# Patient Record
Sex: Male | Born: 1980 | Hispanic: Yes | Marital: Single | Smoking: Never smoker
Health system: Southern US, Community
[De-identification: ages and names within clinical notes are randomized; demographics above are authoritative.]

---

## 2013-04-16 ENCOUNTER — Emergency Department: Payer: Self-pay | Admitting: Emergency Medicine

## 2016-01-18 ENCOUNTER — Encounter: Payer: Self-pay | Admitting: *Deleted

## 2016-01-18 ENCOUNTER — Emergency Department
Admission: EM | Admit: 2016-01-18 | Discharge: 2016-01-18 | Disposition: A | Discharge status: Home / Self Care | Payer: Self-pay | Attending: Emergency Medicine | Admitting: Emergency Medicine

## 2016-01-18 DIAGNOSIS — J069 Acute upper respiratory infection, unspecified: Secondary | ICD-10-CM | POA: Insufficient documentation

## 2016-01-18 MED ORDER — PSEUDOEPH-BROMPHEN-DM 30-2-10 MG/5ML PO SYRP: 10.0000 mL | ORAL_SOLUTION | Freq: Four times a day (QID) | ORAL | Status: AC | PRN

## 2016-01-18 MED ORDER — FLUTICASONE PROPIONATE 50 MCG/ACT NA SUSP: 2.0000 | Freq: Every day | NASAL | Status: AC

## 2016-01-18 NOTE — Discharge Instructions (Signed)

## 2016-01-18 NOTE — ED Notes (Signed)
Spanish interpreter at bedside for discharge.  

## 2016-01-18 NOTE — ED Provider Notes (Signed)
[facility] Emergency Department Provider Note  ____________________________________________  Time seen: Approximately 8:49 PM  I have reviewed the triage vital signs and the nursing notes.   HISTORY  Chief Complaint Nasal Congestion    HPI Roy West is a 35 y.o. male , NAD, presents to the emergency department with 1 month history of nasal congestion and runny nose. Spanish language interpreter present throughout the visit. Has taken NyQuil with minor relief. Has not taken anything during the day. Does note that warmer temperatures improve his nasal breathing. Denies shortness of breath, chest pain, back pain, wheezing. No fevers, chills, body aches.   No past medical history on file.  There are no active problems to display for this patient.   No past surgical history on file.  Current Outpatient Rx  Name  Route  Sig  Dispense  Refill  . brompheniramine-pseudoephedrine-DM 30-2-10 MG/5ML syrup   Oral   Take 10 mLs by mouth 4 (four) times daily as needed.   200 mL   0   . fluticasone (FLONASE) 50 MCG/ACT nasal spray   Each Nare   Place 2 sprays into both nostrils daily.   16 g   0     Allergies Review of patient's allergies indicates no known allergies.  No family history on file.  Social History Social History  Substance Use Topics  . Smoking status: Never Smoker   . Smokeless tobacco: None  . Alcohol Use: Yes     Review of Systems  Constitutional: No fever/chills, fatigue Eyes: No discharge ENT: Positive nasal congestion and profuse clear rhinorrhea. No sore throat or ear pain, sinus pressure or pain. Cardiovascular: No chest pain. Respiratory: Mild cough. No shortness of breath. No wheezing.  Musculoskeletal: Negative for general myalgias.  Skin: Negative for rash. Neurological: Negative for headaches, focal weakness or numbness. 10-point ROS otherwise  negative.  ____________________________________________   PHYSICAL EXAM:  VITAL SIGNS: ED Triage Vitals  Enc Vitals Group     BP 01/18/16 2001 151/99 mmHg     Pulse Rate 01/18/16 2001 68     Resp 01/18/16 2001 20     Temp 01/18/16 2001 99.1 F (37.3 C)     Temp Source 01/18/16 2001 Oral     SpO2 01/18/16 2001 99 %     Weight 01/18/16 2001 145 lb (65.772 kg)     Height 01/18/16 2001  (1.6 m)     Head Cir --      Peak Flow --      Pain Score --      Pain Loc --      Pain Edu? --      Excl. in GC? --     Constitutional: Alert and oriented. Well appearing and in no acute distress. Eyes: Conjunctivae are normal. PERRL. EOMI without pain.  Head: Atraumatic. ENT:      Ears: TMs visualized bilaterally without perforation, erythema, bulging, retraction, effusion. Bilateral external ear canals without swelling, erythema, discharge.      Nose: Moderate Left-sided congestion with trace clear rhinnorhea.      Mouth/Throat: Mucous membranes are moist. Pharynx without erythema, swelling, exudate Neck: Supple with full range of motion Hematological/Lymphatic/Immunilogical: No cervical lymphadenopathy. Cardiovascular: Normal rate, regular rhythm. Normal S1 and S2.   Respiratory: Normal respiratory effort without tachypnea or retractions. Lungs CTAB. Neurologic:  Normal speech and language. No gross focal neurologic deficits are appreciated.  Skin:  Skin is warm, dry and intact. No rash noted. Psychiatric: Mood  and affect are normal. Speech and behavior are normal. Patient exhibits appropriate insight and judgement.   ____________________________________________   LABS  None ____________________________________________  EKG  None ____________________________________________  RADIOLOGY  None ____________________________________________    PROCEDURES  Procedure(s) performed: None   Medications - No data to  display   ____________________________________________   INITIAL IMPRESSION / ASSESSMENT AND PLAN / ED COURSE  Patient's diagnosis is consistent with viral upper respiratory infection. Patient will be discharged home with prescriptions for days and Bromfed-DM to utilize as directed. Patient is to follow up with [facility] if symptoms persist past this treatment course. Patient is given ED precautions to return to the ED for any worsening or new symptoms.    ____________________________________________  FINAL CLINICAL IMPRESSION(S) / ED DIAGNOSES  Final diagnoses:  Upper respiratory infection, viral      NEW MEDICATIONS STARTED DURING THIS VISIT:  New Prescriptions   BROMPHENIRAMINE-PSEUDOEPHEDRINE-DM 30-2-10 MG/5ML SYRUP    Take 10 mLs by mouth 4 (four) times daily as needed.   FLUTICASONE (FLONASE) 50 MCG/ACT NASAL SPRAY    Place 2 sprays into both nostrils daily.         Hope Pigeon, PA-C 01/18/16 2137  Sharman Cheek, MD 01/18/16 [phone]

## 2016-01-18 NOTE — ED Notes (Signed)
Pt reports cough, congestion and runny nose.  No sob or chest pain.  Pt alert.

## 2022-02-17 ENCOUNTER — Encounter: Payer: Self-pay | Admitting: Emergency Medicine

## 2022-02-17 ENCOUNTER — Other Ambulatory Visit: Payer: Self-pay

## 2022-02-17 ENCOUNTER — Emergency Department
Admission: EM | Admit: 2022-02-17 | Discharge: 2022-02-18 | Disposition: A | Discharge status: Home / Self Care | Payer: Self-pay | Attending: Emergency Medicine | Admitting: Emergency Medicine

## 2022-02-17 DIAGNOSIS — R42 Dizziness and giddiness: Secondary | ICD-10-CM

## 2022-02-17 DIAGNOSIS — M546 Pain in thoracic spine: Secondary | ICD-10-CM | POA: Insufficient documentation

## 2022-02-17 DIAGNOSIS — X500XXA Overexertion from strenuous movement or load, initial encounter: Secondary | ICD-10-CM | POA: Insufficient documentation

## 2022-02-17 DIAGNOSIS — E876 Hypokalemia: Secondary | ICD-10-CM | POA: Insufficient documentation

## 2022-02-17 LAB — BASIC METABOLIC PANEL
Anion gap: 13 (ref 5–15)
BUN: 15 mg/dL (ref 6–20)
CO2: 23 mmol/L (ref 22–32)
Calcium: 9.8 mg/dL (ref 8.9–10.3)
Chloride: 99 mmol/L (ref 98–111)
Creatinine, Ser: 1.02 mg/dL (ref 0.61–1.24)
GFR, Estimated: >60 mL/min (ref >60)
Glucose, Bld: 121 mg/dL — ABNORMAL HIGH (ref 70–99)
Potassium: 3.3 mmol/L — ABNORMAL LOW (ref 3.5–5.1)
Sodium: 135 mmol/L (ref 135–145)

## 2022-02-17 LAB — CBC
HCT: 45.4 % (ref 39.0–52.0)
Hemoglobin: 16 g/dL (ref 13.0–17.0)
MCH: 30.2 pg (ref 26.0–34.0)
MCHC: 35.2 g/dL (ref 30.0–36.0)
MCV: 85.7 fL (ref 80.0–100.0)
Platelets: 250 10*3/uL (ref 150–400)
RBC: 5.3 MIL/uL (ref 4.22–5.81)
RDW: 12.6 % (ref 11.5–15.5)
WBC: 8.3 10*3/uL (ref 4.0–10.5)
nRBC: 0 % (ref 0.0–0.2)

## 2022-02-17 LAB — TROPONIN I (HIGH SENSITIVITY): Troponin I (High Sensitivity): 3 ng/L (ref <18)

## 2022-02-17 MED ORDER — POTASSIUM CHLORIDE CRYS ER 20 MEQ PO TBCR
40.0000 meq | EXTENDED_RELEASE_TABLET | Freq: Once | ORAL | Status: AC
Start: 2022-02-18 — End: 2022-02-18
  Administered 2022-02-18: 40 meq via ORAL
  Filled 2022-02-17: qty 2

## 2022-02-17 MED ORDER — KETOROLAC TROMETHAMINE 30 MG/ML IJ SOLN
30.0000 mg | Freq: Once | INTRAMUSCULAR | Status: AC
  Administered 2022-02-18: 30 mg via INTRAMUSCULAR
  Filled 2022-02-17: qty 1

## 2022-02-17 MED ORDER — LIDOCAINE 5 % EX PTCH
1.0000 | MEDICATED_PATCH | CUTANEOUS | Status: DC
  Administered 2022-02-18: 1 via TRANSDERMAL
  Filled 2022-02-17: qty 1

## 2022-02-17 MED ORDER — MECLIZINE HCL 25 MG PO TABS
25.0000 mg | ORAL_TABLET | Freq: Once | ORAL | Status: AC
  Administered 2022-02-18: 25 mg via ORAL
  Filled 2022-02-17: qty 1

## 2022-02-17 NOTE — ED Provider Notes (Signed)
[facility] ? ?[facility] ?Provider Note ? ? ? Event Date/Time  ? First MD Initiated Contact with Patient 02/17/22 2312   ?  (approximate) ? ? ?History  ? ?Back Pain and Dizziness ? ? ?HPI ? ?Roy West is a 41 y.o. male who presents to the ED for evaluation of Back Pain and Dizziness ?  ?Patient presents to the ED for evaluation of left-sided atraumatic thoracic back pain and positional dizziness.  He reports symptoms since Monday.  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  Denies weakness to the extremities, syncopal episodes, fever or IVDU. ? ?Reports positional dizziness whenever he stands quickly or turns his head to the left.  Denies vision changes, tinnitus.  Reports no dizziness right now and reports feeling fine while seated. ? ?History and physical facilitated by Spanish interpreter ? ?Physical Exam  ? ?Triage Vital Signs: ?ED Triage Vitals  ?Enc Vitals Group  ?   BP 02/17/22 2039 (!) 168/98  ?   Pulse Rate 02/17/22 2039 84  ?   Resp 02/17/22 2039 16  ?   Temp 02/17/22 2039 99.2 ?F (37.3 ?C)  ?   Temp Source 02/17/22 2039 Oral  ?   SpO2 02/17/22 2039 100 %  ?   Weight 02/17/22 2040 170 lb (77.1 kg)  ?   Height 02/17/22 2040 5\' 3"  (1.6 m)  ?   Head Circumference --   ?   Peak Flow --   ?   Pain Score 02/17/22 2039 3  ?   Pain Loc --   ?   Pain Edu? --   ?   Excl. in GC? --   ? ? ?Most recent vital signs: ?Vitals:  ? 02/17/22 2330 02/18/22 0030  ?BP: 131/86 130/90  ?Pulse: 78 74  ?Resp: 15 16  ?Temp:    ?SpO2: 97% 97%  ? ? ?General: Awake, no distress.  Pleasant and conversational via interpreter.  Able to stand and ambulate in the room without symptoms. ?CV:  Good peripheral perfusion. RRR ?Resp:  Normal effort. CTAB ?Abd:  No distention. Soft and benign ?MSK:  No deformity noted.  ?Neuro:  No focal deficits appreciated. Cranial nerves II through XII intact ?5/5 strength and sensation in all 4  extremities ?Other:   ? ? ?ED Results / Procedures / Treatments  ? ?Labs ?(all labs ordered are listed, but only abnormal results are displayed) ?Labs Reviewed  ?BASIC METABOLIC PANEL - Abnormal; Notable for the following components:  ?    Result Value  ? Potassium 3.3 (*)   ? Glucose, Bld 121 (*)   ? All other components within normal limits  ?CBC  ?URINALYSIS, ROUTINE W REFLEX MICROSCOPIC  ?TROPONIN I (HIGH SENSITIVITY)  ? ? ?EKG ?Sinus rhythm, rate of 79 bpm.  Normal axis and intervals.  No evidence of acute ischemia. ? ?RADIOLOGY ?CXR reviewed by me without evidence of acute cardiopulmonary pathology. ? ?Official radiology report(s): ?DG Chest 2 View ? ?Result Date: 02/18/2022 ?CLINICAL DATA:  Shortness of breath, dizziness, mid back pain for several days. EXAM: CHEST - 2 VIEW COMPARISON:  None. FINDINGS: The heart size and mediastinal contours are within normal limits. Both lungs are clear. The visualized skeletal structures are unremarkable. IMPRESSION: No active cardiopulmonary disease. Electronically Signed   By: 02/20/2022 M.D.   On: 02/18/2022 00:25   ? ?PROCEDURES and INTERVENTIONS: ? ?.1-3 Lead EKG Interpretation ?Performed by:  Delton Prairie, MD ?Authorized by: Delton Prairie, MD  ? ?  Interpretation: normal   ?  ECG rate:  70 ?  ECG rate assessment: normal   ?  Rhythm: sinus rhythm   ?  Ectopy: none   ?  Conduction: normal   ? ?Medications  ?lidocaine (LIDODERM) 5 % 1 patch (1 patch Transdermal Patch Applied 02/18/22 0032)  ?potassium chloride SA (KLOR-CON M) CR tablet 40 mEq (40 mEq Oral Given 02/18/22 0030)  ?meclizine (ANTIVERT) tablet 25 mg (25 mg Oral Given 02/18/22 0031)  ?ketorolac (TORADOL) 30 MG/ML injection 30 mg (30 mg Intramuscular Given 02/18/22 0031)  ? ? ? ?IMPRESSION / MDM / ASSESSMENT AND PLAN / ED COURSE  ?I reviewed the triage vital signs and the nursing notes. ? ?41 year old male presents to the ED with a few days of dizziness and atraumatic back pain.  He has a reassuring examination  without evidence of neurologic or vascular deficits.  No signs of trauma.  Has some mild paraspinal thoracic tenderness, possibly soft tissue in etiology from frequent heavy lifting in his workplace.  Otherwise normal.  Blood work demonstrates hypokalemia, that is repleted orally.  Normal CBC and troponin.  Clear CXR and nonischemic EKG.  No signs of ACS.  Doubt CNS pathology causing his dizziness.  Resolution of symptoms after repletion of his potassium and meclizine. ? ?Clinical Course as of 02/18/22 0511  ?Fri Feb 18, 2022  ?0112 Reassessed.  Feeling well. [DS]  ?  ?Clinical Course User Index ?[DS] Delton Prairie, MD  ? ? ? ?FINAL CLINICAL IMPRESSION(S) / ED DIAGNOSES  ? ?Final diagnoses:  ?Acute left-sided thoracic back pain  ?Dizziness  ?Hypokalemia  ? ? ? ?Rx / DC Orders  ? ?ED Discharge Orders   ? ?      Ordered  ?  lidocaine (LIDODERM) 5 %  Every 12 hours       ? 02/18/22 0114  ? ?  ?  ? ?  ? ? ? ?Note:  This document was prepared using Dragon voice recognition software and may include unintentional dictation errors. ?  ?Delton Prairie, MD ?02/18/22 [phone] ? ?

## 2022-02-17 NOTE — ED Triage Notes (Signed)
Pt to ED from home c/o dizziness, mid back pain, and intermittent SOB for several days.  Denies cough or n/v/d.  Denies injury to back or urinary changes.  Pt A&Ox4, chest rise even and unlabored, skin WNL and in NAD at this time. ?

## 2022-02-18 ENCOUNTER — Emergency Department: Payer: Self-pay

## 2022-02-18 MED ORDER — LIDOCAINE 5 % EX PTCH: 1.0000 | MEDICATED_PATCH | Freq: Two times a day (BID) | CUTANEOUS | 0 refills | Status: AC

## 2022-02-18 NOTE — Discharge Instructions (Signed)
Please take Tylenol and ibuprofen/Advil for your pain.  It is safe to take them together, or to alternate them every few hours.  Take up to 1000mg of Tylenol at a time, up to 4 times per day.  Do not take more than 4000 mg of Tylenol in 24 hours.  For ibuprofen, take 400-600 mg, 4-5 times per day. ° °Please use lidocaine patches at your site of pain.  Apply 1 patch at a time, leave on for 12 hours, then remove for 12 hours.  12 hours on, 12 hours off.  Do not apply more than 1 patch at a time. ° °

## 2022-08-16 IMAGING — CR DG CHEST 2V
2 series · 2 of 2 positions shown · non-contrast
Comparison: None.

CLINICAL DATA: Shortness of breath, dizziness, mid back pain for
several days.

EXAM:
CHEST - 2 VIEW

[chest pa]
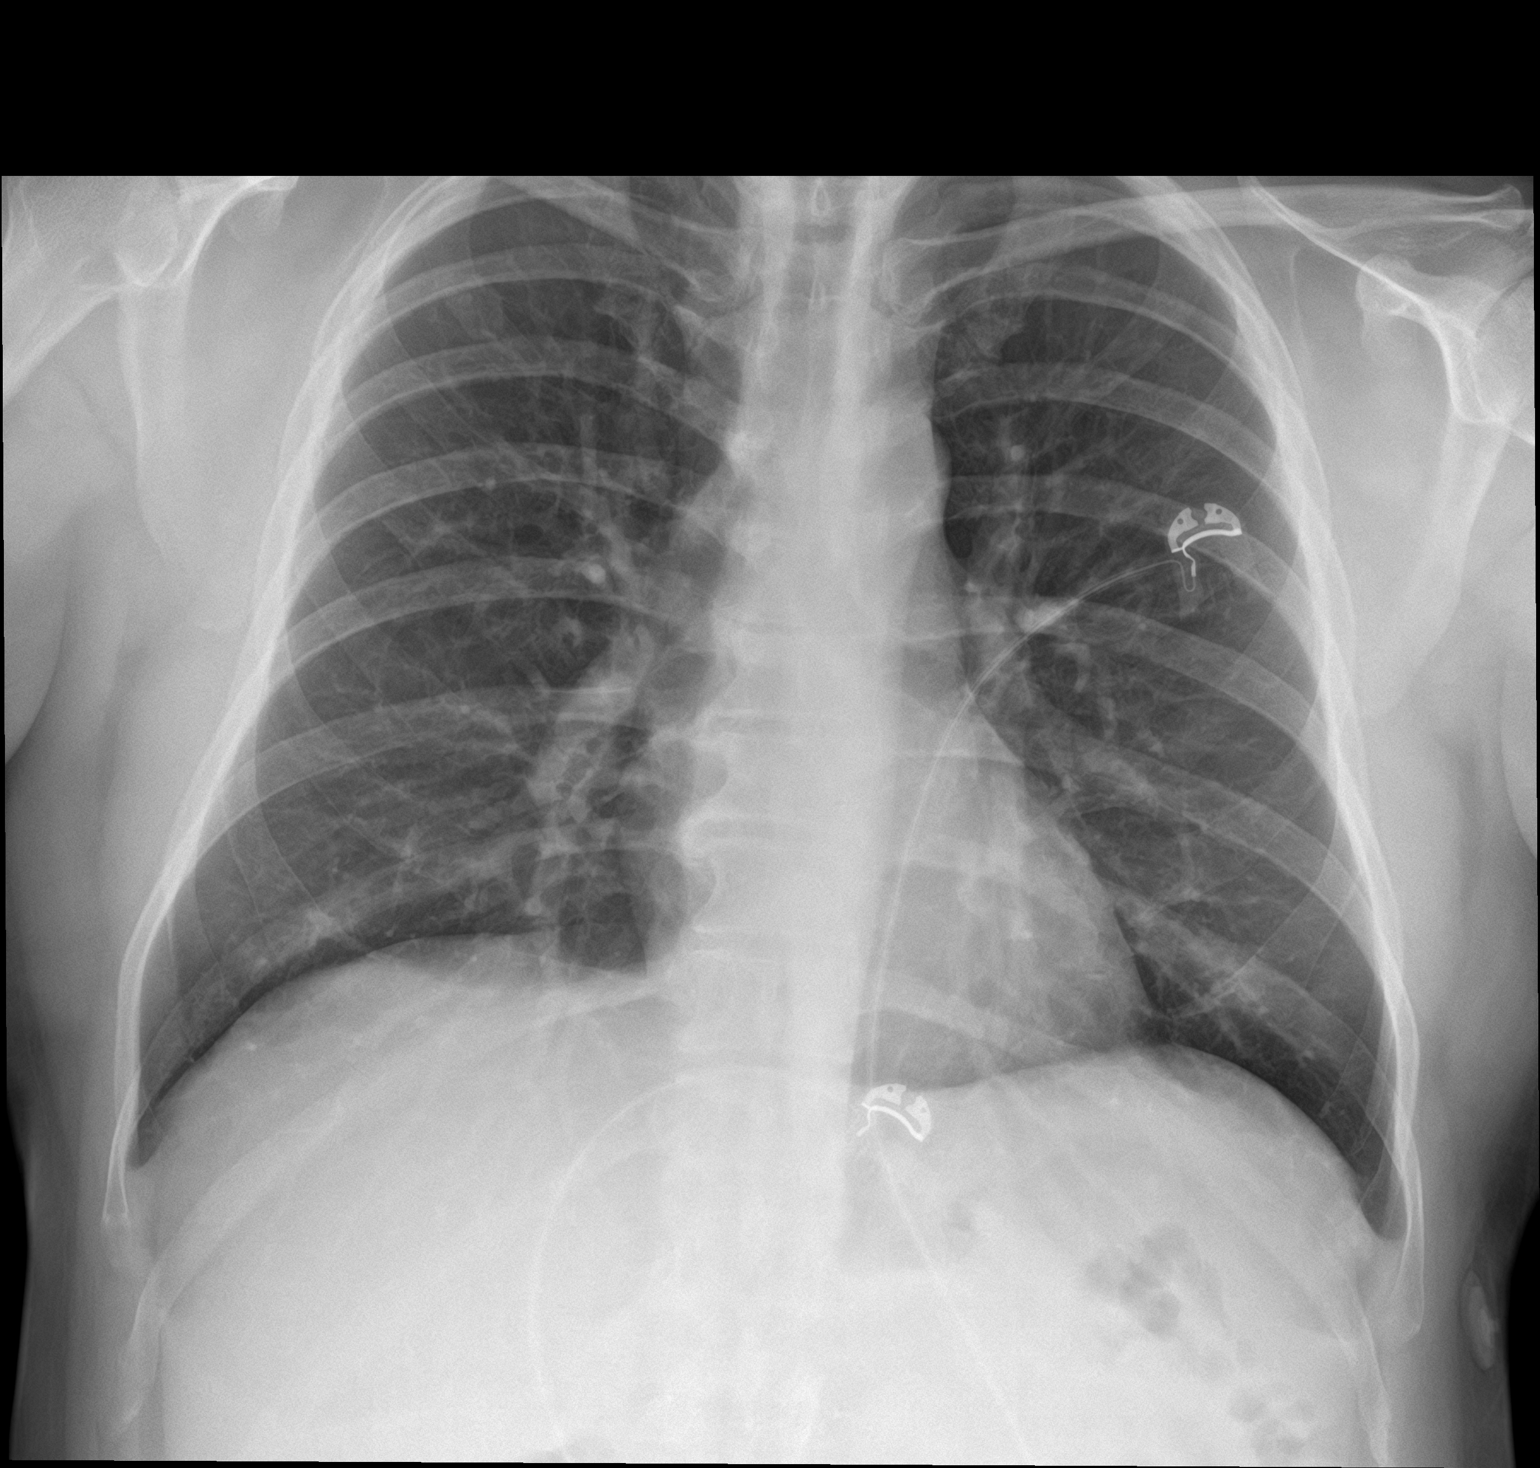

[chest lat]
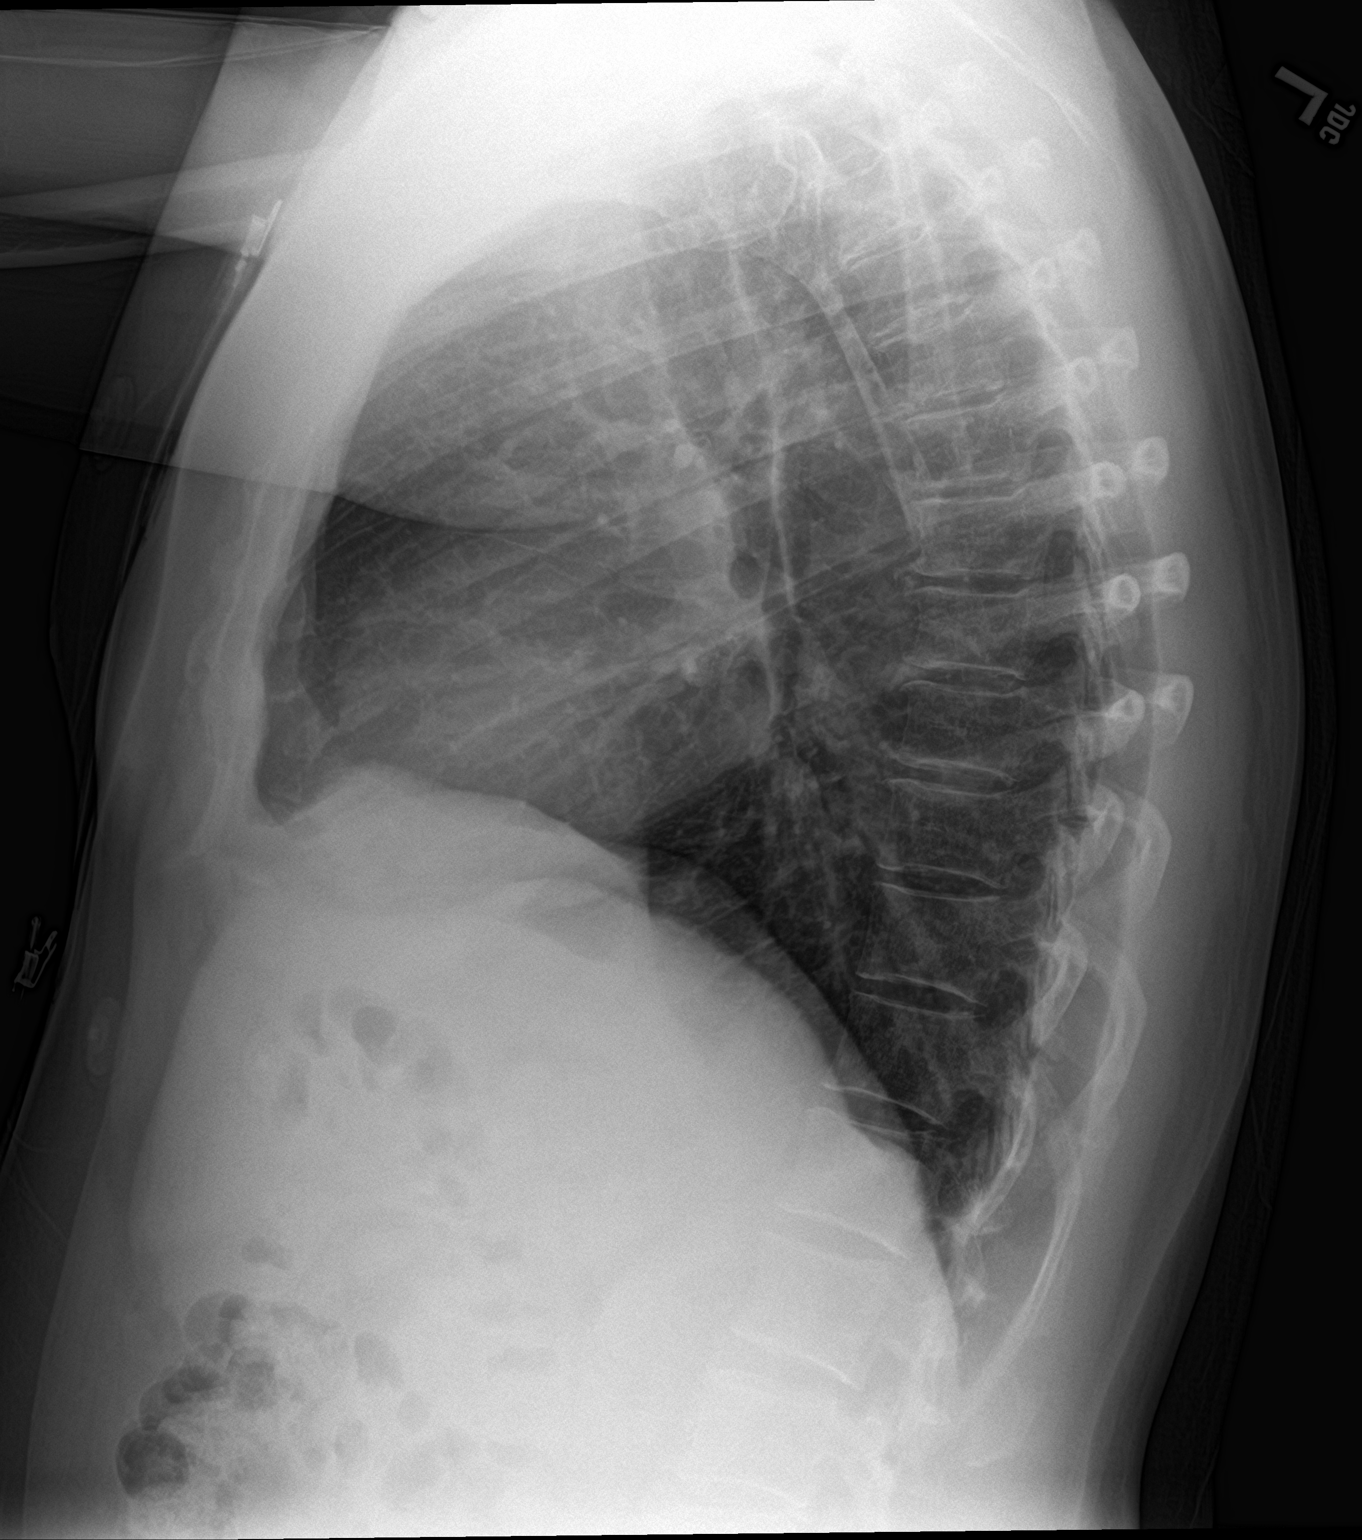

[2 of 2 positions shown; findings below may reference images not displayed]

FINDINGS: The heart size and mediastinal contours are within normal limits.
Both lungs are clear. The visualized skeletal structures are
unremarkable.
IMPRESSION: No active cardiopulmonary disease.

## 2024-11-25 ENCOUNTER — Emergency Department
Admission: EM | Admit: 2024-11-25 | Discharge: 2024-11-25 | Disposition: A | Discharge status: Home / Self Care | Payer: Self-pay | Attending: Emergency Medicine | Admitting: Emergency Medicine

## 2024-11-25 ENCOUNTER — Other Ambulatory Visit: Payer: Self-pay

## 2024-11-25 ENCOUNTER — Emergency Department: Payer: Self-pay

## 2024-11-25 DIAGNOSIS — R0789 Other chest pain: Secondary | ICD-10-CM | POA: Insufficient documentation

## 2024-11-25 DIAGNOSIS — R0602 Shortness of breath: Secondary | ICD-10-CM | POA: Insufficient documentation

## 2024-11-25 LAB — CBC WITH DIFFERENTIAL/PLATELET
Abs Immature Granulocytes: 0.04 K/uL (ref 0.00–0.07)
Basophils Absolute: 0 K/uL (ref 0.0–0.1)
Basophils Relative: 0 %
Eosinophils Absolute: 0 K/uL (ref 0.0–0.5)
Eosinophils Relative: 0 %
HCT: 43.3 % (ref 39.0–52.0)
Hemoglobin: 15.1 g/dL (ref 13.0–17.0)
Immature Granulocytes: 0 %
Lymphocytes Relative: 25 %
Lymphs Abs: 2.6 K/uL (ref 0.7–4.0)
MCH: 30.5 pg (ref 26.0–34.0)
MCHC: 34.9 g/dL (ref 30.0–36.0)
MCV: 87.5 fL (ref 80.0–100.0)
Monocytes Absolute: 0.8 K/uL (ref 0.1–1.0)
Monocytes Relative: 8 %
Neutro Abs: 6.8 K/uL (ref 1.7–7.7)
Neutrophils Relative %: 67 %
Platelets: 233 K/uL (ref 150–400)
RBC: 4.95 MIL/uL (ref 4.22–5.81)
RDW: 12.5 % (ref 11.5–15.5)
WBC: 10.3 K/uL (ref 4.0–10.5)
nRBC: 0 % (ref 0.0–0.2)

## 2024-11-25 LAB — BASIC METABOLIC PANEL WITH GFR
Anion gap: 14 (ref 5–15)
BUN: 13 mg/dL (ref 6–20)
CO2: 22 mmol/L (ref 22–32)
Calcium: 9.3 mg/dL (ref 8.9–10.3)
Chloride: 100 mmol/L (ref 98–111)
Creatinine, Ser: 0.8 mg/dL (ref 0.61–1.24)
GFR, Estimated: >60 mL/min
Glucose, Bld: 123 mg/dL — ABNORMAL HIGH (ref 70–99)
Potassium: 3.6 mmol/L (ref 3.5–5.1)
Sodium: 137 mmol/L (ref 135–145)

## 2024-11-25 LAB — D-DIMER, QUANTITATIVE: D-Dimer, Quant: 0.4 ug{FEU}/mL (ref 0.00–0.50)

## 2024-11-25 LAB — TROPONIN T, HIGH SENSITIVITY
Troponin T High Sensitivity: <15 ng/L (ref 0–19)
Troponin T High Sensitivity: <15 ng/L (ref 0–19)

## 2024-11-25 NOTE — ED Triage Notes (Signed)
 Pt reports he was playing outside with his daughters when he had sudden cp and sob lasting approx 30 min. Reports concern for ongoing SOB while trying to sleep. Sts he feels difficulty when taking in a deep breath.

## 2024-11-25 NOTE — ED Provider Notes (Signed)
 "  [facility] Provider Note    Event Date/Time   First MD Initiated Contact with Patient 11/25/24 [phone]     (approximate)   History   Chief Complaint Shortness of Breath and Chest Pain   HPI  Roy West is a 43 y.o. male with no significant past medical history who presents to the ED complaining of chest pain and shortness of breath.  History is limited as patient is Spanish-speaking only, history obtained via interpreter [phone].  Patient reports sharp discomfort in the left side of his chest starting about 2 hours prior to arrival.  Pain was exacerbated by taking a deep breath, but has since resolved.  He does report some ongoing difficulty breathing, denies any recent fever or cough.  He has not noticed any pain or swelling in his legs.     Physical Exam   Triage Vital Signs: ED Triage Vitals  Encounter Vitals Group     BP 11/25/24 0336 (!) 149/91     Girls Systolic BP Percentile --      Girls Diastolic BP Percentile --      Boys Systolic BP Percentile --      Boys Diastolic BP Percentile --      Pulse Rate 11/25/24 0336 86     Resp 11/25/24 0336 13     Temp 11/25/24 0336 98.3 F (36.8 C)     Temp Source 11/25/24 0336 Oral     SpO2 11/25/24 0336 100 %     Weight --      Height --      Head Circumference --      Peak Flow --      Pain Score 11/25/24 0335 0     Pain Loc --      Pain Education --      Exclude from Growth Chart --     Most recent vital signs: Vitals:   11/25/24 0336  BP: (!) 149/91  Pulse: 86  Resp: 13  Temp: 98.3 F (36.8 C)  SpO2: 100%    Constitutional: Alert and oriented. Eyes: Conjunctivae are normal. Head: Atraumatic. Nose: No congestion/rhinnorhea. Mouth/Throat: Mucous membranes are moist.  Cardiovascular: Normal rate, regular rhythm. Grossly normal heart sounds.  2+ radial pulses bilaterally. Respiratory: Normal respiratory effort.  No retractions. Lungs CTAB.  No chest wall tenderness to  palpation. Gastrointestinal: Soft and nontender. No distention. Musculoskeletal: No lower extremity tenderness nor edema.  Neurologic:  Normal speech and language. No gross focal neurologic deficits are appreciated.    ED Results / Procedures / Treatments   Labs (all labs ordered are listed, but only abnormal results are displayed) Labs Reviewed  BASIC METABOLIC PANEL WITH GFR - Abnormal; Notable for the following components:      Result Value   Glucose, Bld 123 (*)    All other components within normal limits  CBC WITH DIFFERENTIAL/PLATELET  D-DIMER, QUANTITATIVE  TROPONIN T, HIGH SENSITIVITY  TROPONIN T, HIGH SENSITIVITY     EKG  ED ECG REPORT I, Carlin Palin, the attending physician, personally viewed and interpreted this ECG.   Date: 11/25/2024  EKG Time: 3:39  Rate: 78  Rhythm: normal sinus rhythm  Axis: Normal  Intervals:none  ST&T Change: None  RADIOLOGY Chest x-ray reviewed and interpreted by me with no infiltrate, edema, or effusion.  PROCEDURES:  Critical Care performed: No  Procedures   MEDICATIONS ORDERED IN ED: Medications - No data to display   IMPRESSION / MDM / ASSESSMENT AND  PLAN / ED COURSE  I reviewed the triage vital signs and the nursing notes.                              43 y.o. male with no significant past medical history who presents to the ED complaining of approximately 2 hours of sharp discomfort in his chest that has since resolved but with ongoing difficulty breathing.  Patient's presentation is most consistent with acute presentation with potential threat to life or bodily function.  Differential diagnosis includes, but is not limited to, ACS, PE, pneumonia, pneumothorax, musculoskeletal pain, GERD, anxiety.  Patient well-appearing and in no acute distress, vital signs are unremarkable.  EKG shows no evidence of arrhythmia or ischemia and symptoms seem atypical for ACS.  Initial troponin within normal limits, will check  second set given acute onset.  Additional labs without significant anemia, leukocytosis, electrolyte abnormality, or AKI.  Will check D-dimer given pleuritic symptoms with ongoing difficulty breathing.  Chest x-ray is negative for acute process.  2 sets of troponin within normal limits and D-dimer also reassuring.  On reassessment, patient reports that chest pain and difficulty breathing have resolved and he is appropriate for discharge home with outpatient follow-up.  He was counseled to return to the ED for new or worsening symptoms, patient agrees with plan.      FINAL CLINICAL IMPRESSION(S) / ED DIAGNOSES   Final diagnoses:  Atypical chest pain     Rx / DC Orders   ED Discharge Orders     None        Note:  This document was prepared using Dragon voice recognition software and may include unintentional dictation errors.   Willo Dunnings, MD 11/25/24 [phone]  "
